# Patient Record
Sex: Female | Born: 2000 | Race: White | Hispanic: No | Marital: Single | Smoking: Never smoker
Health system: Southern US, Community
[De-identification: ages and names within clinical notes are randomized; demographics above are authoritative.]

## PROBLEM LIST (undated history)

## (undated) DIAGNOSIS — T7840XA Allergy, unspecified, initial encounter: Secondary | ICD-10-CM

---
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
## 2021-02-25 DIAGNOSIS — Z79899 Other long term (current) drug therapy: Secondary | ICD-10-CM | POA: Diagnosis not present

## 2021-02-25 DIAGNOSIS — L700 Acne vulgaris: Secondary | ICD-10-CM | POA: Diagnosis not present

## 2021-03-19 DIAGNOSIS — Z20828 Contact with and (suspected) exposure to other viral communicable diseases: Secondary | ICD-10-CM | POA: Diagnosis not present

## 2021-03-19 DIAGNOSIS — J029 Acute pharyngitis, unspecified: Secondary | ICD-10-CM | POA: Diagnosis not present

## 2021-04-15 DIAGNOSIS — L700 Acne vulgaris: Secondary | ICD-10-CM | POA: Diagnosis not present

## 2021-08-13 DIAGNOSIS — K649 Unspecified hemorrhoids: Secondary | ICD-10-CM | POA: Diagnosis not present

## 2021-09-18 DIAGNOSIS — Z124 Encounter for screening for malignant neoplasm of cervix: Secondary | ICD-10-CM | POA: Diagnosis not present

## 2021-09-18 DIAGNOSIS — Z01419 Encounter for gynecological examination (general) (routine) without abnormal findings: Secondary | ICD-10-CM | POA: Diagnosis not present

## 2021-09-22 DIAGNOSIS — L700 Acne vulgaris: Secondary | ICD-10-CM | POA: Diagnosis not present

## 2021-09-22 DIAGNOSIS — L710 Perioral dermatitis: Secondary | ICD-10-CM | POA: Diagnosis not present

## 2021-10-27 DIAGNOSIS — L578 Other skin changes due to chronic exposure to nonionizing radiation: Secondary | ICD-10-CM | POA: Diagnosis not present

## 2021-10-27 DIAGNOSIS — L814 Other melanin hyperpigmentation: Secondary | ICD-10-CM | POA: Diagnosis not present

## 2021-10-27 DIAGNOSIS — D2272 Melanocytic nevi of left lower limb, including hip: Secondary | ICD-10-CM | POA: Diagnosis not present

## 2021-10-27 DIAGNOSIS — D225 Melanocytic nevi of trunk: Secondary | ICD-10-CM | POA: Diagnosis not present

## 2022-01-18 DIAGNOSIS — Z113 Encounter for screening for infections with a predominantly sexual mode of transmission: Secondary | ICD-10-CM | POA: Diagnosis not present

## 2022-01-18 DIAGNOSIS — Z114 Encounter for screening for human immunodeficiency virus [HIV]: Secondary | ICD-10-CM | POA: Diagnosis not present

## 2022-03-08 DIAGNOSIS — J069 Acute upper respiratory infection, unspecified: Secondary | ICD-10-CM | POA: Diagnosis not present

## 2022-03-08 DIAGNOSIS — J060 Acute laryngopharyngitis: Secondary | ICD-10-CM | POA: Diagnosis not present

## 2022-03-18 DIAGNOSIS — Z0000 Encounter for general adult medical examination without abnormal findings: Secondary | ICD-10-CM | POA: Diagnosis not present

## 2022-03-18 DIAGNOSIS — Z713 Dietary counseling and surveillance: Secondary | ICD-10-CM | POA: Diagnosis not present

## 2022-03-18 DIAGNOSIS — Z6820 Body mass index (BMI) 20.0-20.9, adult: Secondary | ICD-10-CM | POA: Diagnosis not present

## 2022-03-18 DIAGNOSIS — J45990 Exercise induced bronchospasm: Secondary | ICD-10-CM | POA: Diagnosis not present

## 2022-03-18 DIAGNOSIS — Z1331 Encounter for screening for depression: Secondary | ICD-10-CM | POA: Diagnosis not present

## 2022-04-16 DIAGNOSIS — Z79899 Other long term (current) drug therapy: Secondary | ICD-10-CM | POA: Diagnosis not present

## 2022-04-16 DIAGNOSIS — L700 Acne vulgaris: Secondary | ICD-10-CM | POA: Diagnosis not present

## 2022-04-30 DIAGNOSIS — H43391 Other vitreous opacities, right eye: Secondary | ICD-10-CM | POA: Diagnosis not present

## 2022-05-04 DIAGNOSIS — Z23 Encounter for immunization: Secondary | ICD-10-CM | POA: Diagnosis not present

## 2022-05-04 DIAGNOSIS — Z111 Encounter for screening for respiratory tuberculosis: Secondary | ICD-10-CM | POA: Diagnosis not present

## 2022-05-06 DIAGNOSIS — Z0279 Encounter for issue of other medical certificate: Secondary | ICD-10-CM | POA: Diagnosis not present

## 2022-05-19 DIAGNOSIS — Z23 Encounter for immunization: Secondary | ICD-10-CM | POA: Diagnosis not present

## 2022-05-19 DIAGNOSIS — Z111 Encounter for screening for respiratory tuberculosis: Secondary | ICD-10-CM | POA: Diagnosis not present

## 2022-05-21 DIAGNOSIS — Z79899 Other long term (current) drug therapy: Secondary | ICD-10-CM | POA: Diagnosis not present

## 2022-05-21 DIAGNOSIS — L700 Acne vulgaris: Secondary | ICD-10-CM | POA: Diagnosis not present

## 2022-05-22 DIAGNOSIS — Z0279 Encounter for issue of other medical certificate: Secondary | ICD-10-CM | POA: Diagnosis not present

## 2022-06-22 DIAGNOSIS — Z79899 Other long term (current) drug therapy: Secondary | ICD-10-CM | POA: Diagnosis not present

## 2022-06-22 DIAGNOSIS — L308 Other specified dermatitis: Secondary | ICD-10-CM | POA: Diagnosis not present

## 2022-06-22 DIAGNOSIS — L700 Acne vulgaris: Secondary | ICD-10-CM | POA: Diagnosis not present

## 2022-07-22 DIAGNOSIS — Z79899 Other long term (current) drug therapy: Secondary | ICD-10-CM | POA: Diagnosis not present

## 2022-07-22 DIAGNOSIS — L700 Acne vulgaris: Secondary | ICD-10-CM | POA: Diagnosis not present

## 2022-08-05 DIAGNOSIS — H04303 Unspecified dacryocystitis of bilateral lacrimal passages: Secondary | ICD-10-CM | POA: Diagnosis not present

## 2022-08-23 DIAGNOSIS — Z79899 Other long term (current) drug therapy: Secondary | ICD-10-CM | POA: Diagnosis not present

## 2022-08-23 DIAGNOSIS — L700 Acne vulgaris: Secondary | ICD-10-CM | POA: Diagnosis not present

## 2022-09-17 ENCOUNTER — Ambulatory Visit: Payer: Self-pay | Admitting: Podiatry

## 2022-09-21 DIAGNOSIS — L600 Ingrowing nail: Secondary | ICD-10-CM | POA: Diagnosis not present

## 2022-09-22 DIAGNOSIS — Z79899 Other long term (current) drug therapy: Secondary | ICD-10-CM | POA: Diagnosis not present

## 2022-09-22 DIAGNOSIS — L700 Acne vulgaris: Secondary | ICD-10-CM | POA: Diagnosis not present

## 2022-09-24 DIAGNOSIS — Z6820 Body mass index (BMI) 20.0-20.9, adult: Secondary | ICD-10-CM | POA: Diagnosis not present

## 2022-09-24 DIAGNOSIS — Z01419 Encounter for gynecological examination (general) (routine) without abnormal findings: Secondary | ICD-10-CM | POA: Diagnosis not present

## 2022-10-04 ENCOUNTER — Ambulatory Visit: Payer: BC Managed Care – PPO | Admitting: Podiatry

## 2022-10-04 ENCOUNTER — Encounter: Payer: Self-pay | Admitting: Podiatry

## 2022-10-04 DIAGNOSIS — L600 Ingrowing nail: Secondary | ICD-10-CM | POA: Diagnosis not present

## 2022-10-04 NOTE — Patient Instructions (Signed)

## 2022-10-04 NOTE — Progress Notes (Signed)
Subjective:   Patient ID: Amanda Cross, female   DOB: 22 y.o.   MRN: [id number]   HPI Patient presents with her mother with painful ingrown toenails of both borders big toe right and the second toe lateral border stating both of them have really been hurting her and she is on Accutane and seems that that is where it is coming from.  Patient does not smoke likes to be active   Review of Systems  All other systems reviewed and are negative.       Objective:  Physical Exam Vitals and nursing note reviewed.  Constitutional:      Appearance: She is well-developed.  Pulmonary:     Effort: Pulmonary effort is normal.  Musculoskeletal:        General: Normal range of motion.  Skin:    General: Skin is warm.  Neurological:     Mental Status: She is alert.     Neurovascular status intact muscle strength adequate range of motion within normal limits with incurvated medial lateral border of the right hallux with irritation of the tissue and the lateral border of the right second toe with irritation of the tissue left with specks previously doing well.  Good digital perfusion well-oriented     Assessment:  Ingrown toenail x 2 right hallux and second toe     Plan:  H&P reviewed both conditions recommended correction explained procedure risk patient wants surgery and signed consent form understanding risk.  Today I infiltrated the right hallux and second toe with 60 mg like Marcaine mixture sterile prep done using sterile instrumentation removed the medial lateral borders of the right hallux and the lateral border of the right second toe exposed matrix applied phenol 3 applications 30 seconds to each border followed by alcohol lavage sterile dressing gave instructions on soaks reappoint to recheck with all questions answered today

## 2022-10-08 ENCOUNTER — Emergency Department (HOSPITAL_BASED_OUTPATIENT_CLINIC_OR_DEPARTMENT_OTHER): Payer: BC Managed Care – PPO

## 2022-10-08 ENCOUNTER — Other Ambulatory Visit: Payer: Self-pay

## 2022-10-08 ENCOUNTER — Emergency Department (HOSPITAL_BASED_OUTPATIENT_CLINIC_OR_DEPARTMENT_OTHER)
Admission: EM | Admit: 2022-10-08 | Discharge: 2022-10-08 | Disposition: A | Discharge status: Home / Self Care | Payer: BC Managed Care – PPO | Source: Home / Self Care | Attending: Emergency Medicine | Admitting: Emergency Medicine

## 2022-10-08 ENCOUNTER — Encounter (HOSPITAL_BASED_OUTPATIENT_CLINIC_OR_DEPARTMENT_OTHER): Payer: Self-pay | Admitting: Emergency Medicine

## 2022-10-08 DIAGNOSIS — M79674 Pain in right toe(s): Secondary | ICD-10-CM | POA: Diagnosis not present

## 2022-10-08 DIAGNOSIS — L600 Ingrowing nail: Secondary | ICD-10-CM | POA: Diagnosis not present

## 2022-10-08 DIAGNOSIS — L089 Local infection of the skin and subcutaneous tissue, unspecified: Secondary | ICD-10-CM

## 2022-10-08 DIAGNOSIS — M7989 Other specified soft tissue disorders: Secondary | ICD-10-CM | POA: Insufficient documentation

## 2022-10-08 DIAGNOSIS — R202 Paresthesia of skin: Secondary | ICD-10-CM | POA: Diagnosis not present

## 2022-10-08 DIAGNOSIS — S91301A Unspecified open wound, right foot, initial encounter: Secondary | ICD-10-CM | POA: Diagnosis not present

## 2022-10-08 MED ORDER — DOXYCYCLINE HYCLATE 100 MG PO CAPS: 100.0000 mg | ORAL_CAPSULE | Freq: Two times a day (BID) | ORAL | 0 refills | Status: DC

## 2022-10-08 MED ORDER — SULFAMETHOXAZOLE-TRIMETHOPRIM 800-160 MG PO TABS
1.0000 | ORAL_TABLET | Freq: Once | ORAL | Status: AC
  Administered 2022-10-08: 1 via ORAL
  Filled 2022-10-08: qty 1

## 2022-10-08 MED ORDER — SULFAMETHOXAZOLE-TRIMETHOPRIM 800-160 MG PO TABS: 1.0000 | ORAL_TABLET | Freq: Two times a day (BID) | ORAL | 0 refills | Status: AC

## 2022-10-08 NOTE — ED Provider Notes (Signed)
[facility] AT [facility] Provider Note   CSN: [id number] Arrival date & time: 10/08/22  1453     History {Add pertinent medical, surgical, social history, OB history to HPI:1} Chief Complaint  Patient presents with   Toe Pain    Amanda Cross is a 22 y.o. female who presents to the ED today for right toe pain. Patient reports that she saw a podiatrist 5 days ago for ingrown nails for removal of the toenails of her big toe and second toe of the right foot. Patient reports pain to those toes since then. Prior to seeing the podiatrist, patient's PCP sent her in a prescription for Amoxicillin for possible infection of the toe nails. Patient did not take the medication because she thought they were getting better until 2 days ago.  Additionally, she was at the beach with her family for the past several days. She kept her foot wrapped and did not go in the water but she was walking around in the sand.  Now, patient has pain and redness to her 1st and 2nd toes on her right foot. She reports tingling to the toes and is not able to bare weight on them. No fevers. Patient maintains sensation and range of motion to the toes. She has taken Ibuprofen for the pain with some relief. No other complaints or concerns at this time.    Home Medications Prior to Admission medications   Medication Sig Start Date End Date Taking? Authorizing Provider  albuterol (VENTOLIN HFA) 108 (90 Base) MCG/ACT inhaler Inhale into the lungs every 6 (six) hours as needed for wheezing or shortness of breath.    [provider]  mupirocin ointment (BACTROBAN) 2 %  09/07/16   [provider]  norgestrel-ethinyl estradiol (LO/OVRAL) 0.3-30 MG-MCG tablet Take 1 tablet by mouth daily.    Alver Fisher, RN  ondansetron (ZOFRAN) 4 MG tablet Take 1 tablet (4 mg total) by mouth every 8 (eight) hours as needed for nausea or vomiting. 06/04/19   Scheeler, Kermit Balo, PA-C  terbinafine  (LAMISIL AT) 1 % cream Apply 1 application topically 2 (two) times daily. 03/18/19   Bosie Clos, MD      Allergies    Patient has no known allergies.    Review of Systems   Review of Systems  Skin:        Pain and swelling to her 1st and 2nd toe of the right foot  All other systems reviewed and are negative.   Physical Exam Updated Vital Signs BP 129/82 (BP Location: Right Arm)   Pulse 66   Temp 98.2 F (36.8 C)   Resp 16   SpO2 100%  Physical Exam Vitals and nursing note reviewed.  Constitutional:      Appearance: Normal appearance.  HENT:     Head: Normocephalic and atraumatic.     Mouth/Throat:     Mouth: Mucous membranes are moist.  Eyes:     Conjunctiva/sclera: Conjunctivae normal.     Pupils: Pupils are equal, round, and reactive to light.  Cardiovascular:     Pulses: Normal pulses.  Pulmonary:     Effort: Pulmonary effort is normal.     Breath sounds: Normal breath sounds.  Abdominal:     Palpations: Abdomen is soft.     Tenderness: There is no abdominal tenderness.  Skin:    General: Skin is warm and dry.     Findings: Erythema and lesion present. No rash.  Comments: Erythema and edema to the 1st and 2nd toes of the right foot  Neurological:     General: No focal deficit present.     Mental Status: She is alert.  Psychiatric:        Mood and Affect: Mood normal.        Behavior: Behavior normal.     ED Results / Procedures / Treatments   Labs (all labs ordered are listed, but only abnormal results are displayed) Labs Reviewed - No data to display  EKG None  Radiology No results found.  Procedures Procedures: not indicated. {Document cardiac monitor, telemetry assessment procedure when appropriate:1}  Medications Ordered in ED Medications - No data to display  ED Course/ Medical Decision Making/ A&P   {   Click here for ABCD2, HEART and other calculatorsREFRESH Note before signing :1}                          Medical  Decision Making Amount and/or Complexity of Data Reviewed Radiology: ordered.   Patient is a 22 year old female with no significant past medical history presents the ED today with pain to the first and second digits of the right foot.  The pain started 2 days ago and has been worsening since.  My differentials include: Infected ingrown toenails, osteomyelitis, cellulitis, etc.  {Document critical care time when appropriate:1} {Document review of labs and clinical decision tools ie heart score, Chads2Vasc2 etc:1}  {Document your independent review of radiology images, and any outside records:1} {Document your discussion with family members, caretakers, and with consultants:1} {Document social determinants of health affecting pt's care:1} {Document your decision making why or why not admission, treatments were needed:1} Final Clinical Impression(s) / ED Diagnoses Final diagnoses:  None    Rx / DC Orders ED Discharge Orders     None

## 2022-10-08 NOTE — Discharge Instructions (Addendum)
Stop Amoxicillin. Start Bactrim twice a day for 7 days. Please complete entirety of medication.  Call podiatrist and follow up with them on Monday for further evaluation. Continue epsom salt soaks.  Return to ED if streaking or increased/worsening redness to toes.

## 2022-10-08 NOTE — ED Triage Notes (Addendum)
Pt presents to ED Pov. Pt c/o r big toe pain, redness and swelling since Wednesday. Started taking amoxicillin today

## 2022-10-08 NOTE — ED Notes (Signed)
Pt sitting in chair, pt has redness and swelling to her R big toe and 2nd toe, pt states that she recently had surgery for an ingrown toenail.  Pt doesn't have swelling to ankle or leg.

## 2022-10-20 ENCOUNTER — Encounter: Payer: Self-pay | Admitting: Podiatry

## 2022-10-20 ENCOUNTER — Ambulatory Visit: Payer: BC Managed Care – PPO | Admitting: Podiatry

## 2022-10-20 DIAGNOSIS — L03031 Cellulitis of right toe: Secondary | ICD-10-CM | POA: Diagnosis not present

## 2022-10-20 NOTE — Progress Notes (Signed)
Subjective:   Patient ID: Amanda Cross, female   DOB: 22 y.o.   MRN: [id number]   HPI Patient presents for follow-up states that she did get an infection we were close she went to the emergency room is on antibiotics seems better now but still has some redness wants it checked   ROS      Objective:  Physical Exam  Neuro vascular status intact with crusted tissue on the right hallux second toe that is localized no proximal edema edema currently noted significant reduction of discomfort     Assessment:  Paronychia infection right appears to be resolved     Plan:  Advised on soaks and bandage usage during the day and this should heal uneventfully if any further redness were to occur let us know immediately other than that appears healed well

## 2022-10-22 DIAGNOSIS — L810 Postinflammatory hyperpigmentation: Secondary | ICD-10-CM | POA: Diagnosis not present

## 2022-10-22 DIAGNOSIS — Z79899 Other long term (current) drug therapy: Secondary | ICD-10-CM | POA: Diagnosis not present

## 2022-10-22 DIAGNOSIS — L700 Acne vulgaris: Secondary | ICD-10-CM | POA: Diagnosis not present

## 2022-11-03 ENCOUNTER — Ambulatory Visit: Payer: BC Managed Care – PPO | Admitting: Podiatry

## 2022-11-03 ENCOUNTER — Encounter: Payer: Self-pay | Admitting: Podiatry

## 2022-11-03 DIAGNOSIS — L03032 Cellulitis of left toe: Secondary | ICD-10-CM

## 2022-11-03 NOTE — Patient Instructions (Signed)

## 2022-11-04 NOTE — Progress Notes (Signed)
Subjective:   Patient ID: Amanda Cross, female   DOB: 22 y.o.   MRN: [id number]   HPI Patient presents stating that the big toenail left has started to become sore and the right still gives her irritation but minimal discomfort.  Patient presents with mother and still taking Accutane   ROS      Objective:  Physical Exam  Neurovascular status intact with patient found to have crusted right hallux medial lateral border second digit and on the left hallux and noted redness on the lateral border no active drainage no proximal edema erythema drainage     Assessment:  Paronychia infection left hallux lateral border with a well-healing ingrown toenail site right     Plan:  H&P reviewed for the left have not recommended permanent procedure she is getting ready to go back to school but I do think due to the infection it needs to be resolved and I anesthetized 60 mg Xylocaine Marcaine mixture sterile prep done using sterile instrumentation remove the lateral border removed necrotic tissue flushed the wound applied sterile dressing instructed on soaks and continue bandage usage for the right

## 2022-11-24 DIAGNOSIS — K130 Diseases of lips: Secondary | ICD-10-CM | POA: Diagnosis not present

## 2022-11-24 DIAGNOSIS — L308 Other specified dermatitis: Secondary | ICD-10-CM | POA: Diagnosis not present

## 2022-11-24 DIAGNOSIS — Z79899 Other long term (current) drug therapy: Secondary | ICD-10-CM | POA: Diagnosis not present

## 2022-11-24 DIAGNOSIS — L700 Acne vulgaris: Secondary | ICD-10-CM | POA: Diagnosis not present

## 2022-12-07 DIAGNOSIS — R300 Dysuria: Secondary | ICD-10-CM | POA: Diagnosis not present

## 2022-12-29 DIAGNOSIS — Z79899 Other long term (current) drug therapy: Secondary | ICD-10-CM | POA: Diagnosis not present

## 2022-12-29 DIAGNOSIS — L700 Acne vulgaris: Secondary | ICD-10-CM | POA: Diagnosis not present

## 2023-01-28 DIAGNOSIS — L700 Acne vulgaris: Secondary | ICD-10-CM | POA: Diagnosis not present

## 2023-01-28 DIAGNOSIS — Z79899 Other long term (current) drug therapy: Secondary | ICD-10-CM | POA: Diagnosis not present

## 2023-03-06 DIAGNOSIS — R197 Diarrhea, unspecified: Secondary | ICD-10-CM | POA: Diagnosis not present

## 2023-03-06 DIAGNOSIS — R112 Nausea with vomiting, unspecified: Secondary | ICD-10-CM | POA: Diagnosis not present

## 2023-03-06 DIAGNOSIS — R1084 Generalized abdominal pain: Secondary | ICD-10-CM | POA: Diagnosis not present

## 2023-03-06 DIAGNOSIS — A084 Viral intestinal infection, unspecified: Secondary | ICD-10-CM | POA: Diagnosis not present

## 2023-03-10 ENCOUNTER — Other Ambulatory Visit: Payer: Self-pay

## 2023-03-10 ENCOUNTER — Emergency Department (HOSPITAL_BASED_OUTPATIENT_CLINIC_OR_DEPARTMENT_OTHER)
Admission: EM | Admit: 2023-03-10 | Discharge: 2023-03-10 | Disposition: A | Discharge status: Home / Self Care | Payer: BC Managed Care – PPO | Attending: Emergency Medicine | Admitting: Emergency Medicine

## 2023-03-10 ENCOUNTER — Encounter (HOSPITAL_BASED_OUTPATIENT_CLINIC_OR_DEPARTMENT_OTHER): Payer: Self-pay

## 2023-03-10 DIAGNOSIS — K529 Noninfective gastroenteritis and colitis, unspecified: Secondary | ICD-10-CM | POA: Diagnosis not present

## 2023-03-10 DIAGNOSIS — R197 Diarrhea, unspecified: Secondary | ICD-10-CM | POA: Diagnosis not present

## 2023-03-10 LAB — CBC
HCT: 42.8 % (ref 36.0–46.0)
Hemoglobin: 14.3 g/dL (ref 12.0–15.0)
MCH: 29.9 pg (ref 26.0–34.0)
MCHC: 33.4 g/dL (ref 30.0–36.0)
MCV: 89.5 fL (ref 80.0–100.0)
Platelets: 253 10*3/uL (ref 150–400)
RBC: 4.78 MIL/uL (ref 3.87–5.11)
RDW: 11.9 % (ref 11.5–15.5)
WBC: 5.1 10*3/uL (ref 4.0–10.5)
nRBC: 0 % (ref 0.0–0.2)

## 2023-03-10 LAB — COMPREHENSIVE METABOLIC PANEL
ALT: 12 U/L (ref 0–44)
AST: 16 U/L (ref 15–41)
Albumin: 4.6 g/dL (ref 3.5–5.0)
Alkaline Phosphatase: 50 U/L (ref 38–126)
Anion gap: 7 (ref 5–15)
BUN: 13 mg/dL (ref 6–20)
CO2: 30 mmol/L (ref 22–32)
Calcium: 10.2 mg/dL (ref 8.9–10.3)
Chloride: 102 mmol/L (ref 98–111)
Creatinine, Ser: 0.86 mg/dL (ref 0.44–1.00)
GFR, Estimated: >60 mL/min (ref >60)
Glucose, Bld: 109 mg/dL — ABNORMAL HIGH (ref 70–99)
Potassium: 4.5 mmol/L (ref 3.5–5.1)
Sodium: 139 mmol/L (ref 135–145)
Total Bilirubin: 0.5 mg/dL (ref <1.2)
Total Protein: 7.8 g/dL (ref 6.5–8.1)

## 2023-03-10 LAB — LIPASE, BLOOD: Lipase: 28 U/L (ref 11–51)

## 2023-03-10 LAB — URINALYSIS, ROUTINE W REFLEX MICROSCOPIC
Bilirubin Urine: NEGATIVE
Glucose, UA: NEGATIVE mg/dL
Hgb urine dipstick: NEGATIVE
Ketones, ur: NEGATIVE mg/dL
Leukocytes,Ua: NEGATIVE
Nitrite: NEGATIVE
Protein, ur: NEGATIVE mg/dL
Specific Gravity, Urine: 1.027 (ref 1.005–1.030)
pH: 6.5 (ref 5.0–8.0)

## 2023-03-10 LAB — PREGNANCY, URINE: Preg Test, Ur: NEGATIVE

## 2023-03-10 MED ORDER — DICYCLOMINE HCL 20 MG PO TABS: 20.0000 mg | ORAL_TABLET | Freq: Two times a day (BID) | ORAL | 0 refills | Status: DC

## 2023-03-10 NOTE — ED Triage Notes (Signed)
Pt c/o diarrhea x10 days, "mainly in the mornings & after I eat, it's just not seeming to get better." Went to UC for same Sunday, "said it was probably just a virus but I haven't felt bad otherwise."   LMP "about a week ago."

## 2023-03-10 NOTE — ED Notes (Signed)
Patient left before receiving discharge papers.  Not in room when came to D/C.

## 2023-03-10 NOTE — Discharge Instructions (Signed)
You were seen in the emergency room today for diarrhea.  And recommending taking Imodium 4mg  x1, then 2mg  after each loose stool with MAX of 16mg .  Please try bland diet, avoiding lactose might cheese or milk.  Please make sure you are staying well-hydrated with Pedialyte and Gatorade, water. return to emergency room if you have any new or worsening symptoms.

## 2023-03-10 NOTE — ED Provider Notes (Signed)
[place] EMERGENCY DEPARTMENT AT [facility] Provider Note   CSN: [id number] Arrival date & time: 03/10/23  1322     History  Chief Complaint  Patient presents with   Diarrhea    Amanda Cross is a 22 y.o. female patient without significant past medical history reporting to emergency room with approximately 1 week of nausea and diarrhea.  Patient reports she was seen at urgent care 3 days ago for similar thing.  Patient reports when this for started she was having up to 15 bowel movements a day.  Patient reports she is having less bowel movements.  Patient reports improvement in symptoms after Imodium but only temporarily.  Patient does not note any decreased appetite, or vomiting.  Patient notes abdominal pain but feels it is cramping and generalized in nature.  Denies any blood in stool, mucus, change in stools appearance.  Patient has been trying bland diet.  Denies any chest pain, shortness of breath, URI-like symptoms, fevers, chills.   Diarrhea      Home Medications Prior to Admission medications   Medication Sig Start Date End Date Taking? Authorizing Provider  albuterol (VENTOLIN HFA) 108 (90 Base) MCG/ACT inhaler Inhale into the lungs every 6 (six) hours as needed for wheezing or shortness of breath.    [provider]  mupirocin ointment (BACTROBAN) 2 %  09/07/16   [provider]  norgestrel-ethinyl estradiol (LO/OVRAL) 0.3-30 MG-MCG tablet Take 1 tablet by mouth daily.    Alver Fisher, RN  ondansetron (ZOFRAN) 4 MG tablet Take 1 tablet (4 mg total) by mouth every 8 (eight) hours as needed for nausea or vomiting. 06/04/19   Scheeler, Kermit Balo, PA-C  terbinafine (LAMISIL AT) 1 % cream Apply 1 application topically 2 (two) times daily. 03/18/19   Bosie Clos, MD      Allergies    Patient has no known allergies.    Review of Systems   Review of Systems  Gastrointestinal:  Positive for diarrhea.    Physical Exam Updated Vital  Signs BP (!) 131/92 (BP Location: Right Arm)   Pulse 97   Temp 98.1 F (36.7 C)   Resp 18   SpO2 100%  Physical Exam Vitals and nursing note reviewed.  Constitutional:      General: She is not in acute distress.    Appearance: She is not toxic-appearing.  HENT:     Head: Normocephalic and atraumatic.     Mouth/Throat:     Comments: Patient is well-appearing with moist mucous membranes.  Hemodynamically stable without signs of distress. Eyes:     General: No scleral icterus.    Conjunctiva/sclera: Conjunctivae normal.  Cardiovascular:     Rate and Rhythm: Normal rate and regular rhythm.     Pulses: Normal pulses.     Heart sounds: Normal heart sounds.  Pulmonary:     Effort: Pulmonary effort is normal. No respiratory distress.     Breath sounds: Normal breath sounds.  Abdominal:     General: Abdomen is flat. Bowel sounds are normal.     Palpations: Abdomen is soft.     Tenderness: There is no abdominal tenderness.     Comments: Abdomen is soft nontender  Skin:    General: Skin is warm and dry.     Findings: No lesion.  Neurological:     General: No focal deficit present.     Mental Status: She is alert and oriented to person, place, and time. Mental status is at  baseline.     ED Results / Procedures / Treatments   Labs (all labs ordered are listed, but only abnormal results are displayed) Labs Reviewed  COMPREHENSIVE METABOLIC PANEL - Abnormal; Notable for the following components:      Result Value   Glucose, Bld 109 (*)    All other components within normal limits  LIPASE, BLOOD  CBC  URINALYSIS, ROUTINE W REFLEX MICROSCOPIC  PREGNANCY, URINE    EKG None  Radiology No results found.  Procedures Procedures    Medications Ordered in ED Medications - No data to display  ED Course/ Medical Decision Making/ A&P                                 Medical Decision Making Amount and/or Complexity of Data Reviewed Labs: ordered.  Risk Prescription drug  management.   Amanda Cross 22 y.o. presented today for abd pain. Working DDx includes, but not limited to, gastroenteritis, colitis, SBO, appendicitis, cholecystitis, hepatobiliary pathology, gastritis, PUD, ACS, dissection, pancreatitis, nephrolithiasis, AAA, UTI, pyelonephritis, ruptured ectopic pregnancy, PID, ovarian torsion.  R/o DDx: These are considered less likely than current impression due to history of present illness, physical exam, labs/imaging findings.  Review of prior external notes: UC visit for same 03/06/2023  Pmhx: None   Unique Tests and My Interpretation:  CBC with differential: No leukocytosis, no anemia CMP: No electrolyte abnormality, no AKI.  Glucose 109 Lipase: wnl UA: No nitrates no leukocytes and no blood in urine. Urine Pregnancy: NEG   Imaging:  Considered however patient is not having any focal abdominal pain.  Patient has reassuring abdominal exam without any guarding.  Patient also does not have any leukocytosis nor fever.  Patient's abdominal pain comes and go and is cramping in nature and thus I do not feel imaging is necessary at this time.  Problem List / ED Course / Critical interventions / Medication management  Patient reporting to emergency room with 10 days of nausea and diarrhea.  Discussed obtaining stool samples however patient does not feel like she can have bowel movement.  Patient did not have bowel movement throughout stay.  Patient is overall reassuring workup.  Patient appears well-hydrated on exam with moist mucous membranes.  No AKI or electrolyte abnormality on blood work.  No leukocytosis.  Patient hemodynamically stable and tolerating p.o. intake.  Discussed bland diet as well as clear liquid diet and slowly transitioning back into normal foods.  Also discussed using Imodium or Pepto-Bismol regularly to control symptoms.  Discussed taking nausea medicine like Zofran.  Discussed importance of staying well-hydrated with water  alternating Pedialyte and Gatorade.  Patient family member agree and understand plan.  If symptoms change and will return to emergency room. Medication offered and declined. Patients vitals assessed. Upon arrival patient is hemodynamically stable.  I have reviewed the patients home medicines and have made adjustments as needed    Consult: none   Plan:  F/u w/ PCP in 2-3d to ensure resolution of sx.  Patient was given return precautions. Patient stable for discharge at this time.  Patient educated on current sx/dx and verbalized understanding of plan. Return to ER w/ new or worsening sx.          Final Clinical Impression(s) / ED Diagnoses Final diagnoses:  Gastroenteritis    Rx / DC Orders ED Discharge Orders     None  Smitty Knudsen, PA-C 03/11/23 0245    Glyn Ade, MD 03/14/23 [phone]

## 2023-03-12 ENCOUNTER — Encounter (HOSPITAL_BASED_OUTPATIENT_CLINIC_OR_DEPARTMENT_OTHER): Payer: Self-pay

## 2023-03-12 ENCOUNTER — Emergency Department (HOSPITAL_BASED_OUTPATIENT_CLINIC_OR_DEPARTMENT_OTHER)
Admission: EM | Admit: 2023-03-12 | Discharge: 2023-03-12 | Disposition: A | Discharge status: Home / Self Care | Payer: BC Managed Care – PPO | Attending: Emergency Medicine | Admitting: Emergency Medicine

## 2023-03-12 ENCOUNTER — Emergency Department (HOSPITAL_BASED_OUTPATIENT_CLINIC_OR_DEPARTMENT_OTHER): Payer: BC Managed Care – PPO

## 2023-03-12 ENCOUNTER — Telehealth (HOSPITAL_BASED_OUTPATIENT_CLINIC_OR_DEPARTMENT_OTHER): Payer: Self-pay | Admitting: Emergency Medicine

## 2023-03-12 ENCOUNTER — Other Ambulatory Visit: Payer: Self-pay

## 2023-03-12 DIAGNOSIS — R109 Unspecified abdominal pain: Secondary | ICD-10-CM | POA: Diagnosis not present

## 2023-03-12 DIAGNOSIS — R1031 Right lower quadrant pain: Secondary | ICD-10-CM | POA: Insufficient documentation

## 2023-03-12 DIAGNOSIS — R1032 Left lower quadrant pain: Secondary | ICD-10-CM | POA: Diagnosis not present

## 2023-03-12 LAB — CBC WITH DIFFERENTIAL/PLATELET
Abs Immature Granulocytes: 0.01 10*3/uL (ref 0.00–0.07)
Basophils Absolute: 0 10*3/uL (ref 0.0–0.1)
Basophils Relative: 1 %
Eosinophils Absolute: 0.2 10*3/uL (ref 0.0–0.5)
Eosinophils Relative: 3 %
HCT: 38.1 % (ref 36.0–46.0)
Hemoglobin: 12.9 g/dL (ref 12.0–15.0)
Immature Granulocytes: 0 %
Lymphocytes Relative: 31 %
Lymphs Abs: 1.9 10*3/uL (ref 0.7–4.0)
MCH: 30.2 pg (ref 26.0–34.0)
MCHC: 33.9 g/dL (ref 30.0–36.0)
MCV: 89.2 fL (ref 80.0–100.0)
Monocytes Absolute: 0.6 10*3/uL (ref 0.1–1.0)
Monocytes Relative: 10 %
Neutro Abs: 3.3 10*3/uL (ref 1.7–7.7)
Neutrophils Relative %: 55 %
Platelets: 199 10*3/uL (ref 150–400)
RBC: 4.27 MIL/uL (ref 3.87–5.11)
RDW: 11.8 % (ref 11.5–15.5)
WBC: 5.9 10*3/uL (ref 4.0–10.5)
nRBC: 0 % (ref 0.0–0.2)

## 2023-03-12 LAB — COMPREHENSIVE METABOLIC PANEL
ALT: 10 U/L (ref 0–44)
AST: 13 U/L — ABNORMAL LOW (ref 15–41)
Albumin: 4 g/dL (ref 3.5–5.0)
Alkaline Phosphatase: 50 U/L (ref 38–126)
Anion gap: 7 (ref 5–15)
BUN: 12 mg/dL (ref 6–20)
CO2: 27 mmol/L (ref 22–32)
Calcium: 9.1 mg/dL (ref 8.9–10.3)
Chloride: 103 mmol/L (ref 98–111)
Creatinine, Ser: 0.75 mg/dL (ref 0.44–1.00)
GFR, Estimated: >60 mL/min (ref >60)
Glucose, Bld: 93 mg/dL (ref 70–99)
Potassium: 3.9 mmol/L (ref 3.5–5.1)
Sodium: 137 mmol/L (ref 135–145)
Total Bilirubin: 0.5 mg/dL (ref <1.2)
Total Protein: 6.8 g/dL (ref 6.5–8.1)

## 2023-03-12 LAB — C DIFFICILE QUICK SCREEN W PCR REFLEX
C Diff antigen: POSITIVE — AB
C Diff toxin: NEGATIVE

## 2023-03-12 LAB — PREGNANCY, URINE: Preg Test, Ur: NEGATIVE

## 2023-03-12 MED ORDER — VANCOMYCIN HCL 125 MG PO CAPS: 125.0000 mg | ORAL_CAPSULE | Freq: Four times a day (QID) | ORAL | 0 refills | Status: DC

## 2023-03-12 MED ORDER — LACTATED RINGERS IV BOLUS
1000.0000 mL | Freq: Once | INTRAVENOUS | Status: AC
  Administered 2023-03-12: 1000 mL via INTRAVENOUS

## 2023-03-12 MED ORDER — VANCOMYCIN HCL 125 MG PO CAPS: 125.0000 mg | ORAL_CAPSULE | Freq: Four times a day (QID) | ORAL | 0 refills | Status: AC

## 2023-03-12 MED ORDER — IOHEXOL 300 MG/ML  SOLN
80.0000 mL | Freq: Once | INTRAMUSCULAR | Status: AC | PRN
  Administered 2023-03-12: 80 mL via INTRAVENOUS

## 2023-03-12 NOTE — Telephone Encounter (Signed)
C. difficile positive.  Will send in oral vancomycin.  Will have her follow-up with her primary care doctor.

## 2023-03-12 NOTE — ED Notes (Signed)
Patient given discharge instructions. Questions were answered. Patient verbalized understanding of discharge instructions and care at home.  

## 2023-03-12 NOTE — ED Notes (Signed)
Spoke with patient about positive C diff antigen result; MD Curatolo has advised for patient to start course of abx to treat c diff. Pt educated on abx course and to return to the ER if she does not feel better. Pt verbalized understanding. 03/12/23 1955 (when call ended)

## 2023-03-12 NOTE — ED Triage Notes (Signed)
Pt c/o diarrhea x12 days. Pt reports random episodes throughout the day. Pt reports she was seen here and at [facility] and s/s are still present. Pt reports "nothing" is helping.Pt reports LMP about 1 week ago.

## 2023-03-12 NOTE — Telephone Encounter (Signed)
Abx sent to different pharm

## 2023-03-12 NOTE — ED Provider Notes (Signed)
[place] EMERGENCY DEPARTMENT AT [facility] Provider Note   CSN: [id number] Arrival date & time: 03/12/23  0631     History  Chief Complaint  Patient presents with   Abdominal Pain    Amanda Cross is a 22 y.o. female.  HPI     22 year old female without significant medical history, recent urgent care and emergency department visit for nausea and diarrhea who presents with concern for abdominal pain, mild continued loose stool.  Symptoms started approximately 12 days ago. When this started she was having up to 15 bowel movements a day  Abdominal pain getting worse rather than better, feels like more severe cramping, when severe it is bad, then is sore in between, can feel gas pain.  When pain severe will gag, nausea. Otherwise no vomiting. No urinary symptoms. No fever. Appetite is ok but worried about pain being worse with eating. No vaginal bleeding or discharge. Mucus diarrhea, at times green or yellow.  Eating less.  Loose stool at this point, 2 times a day.   No other medical problems, someone in fam with diverticulitis no known IBD hx    Past Medical History:  Diagnosis Date   Allergy    exercise     Home Medications Prior to Admission medications   Medication Sig Start Date End Date Taking? Authorizing Provider  albuterol (VENTOLIN HFA) 108 (90 Base) MCG/ACT inhaler Inhale into the lungs every 6 (six) hours as needed for wheezing or shortness of breath.    [provider]  dicyclomine (BENTYL) 20 MG tablet Take 1 tablet (20 mg total) by mouth 2 (two) times daily. 03/10/23   Barrett, Horald Chestnut, PA-C  mupirocin ointment (BACTROBAN) 2 %  09/07/16   [provider]  norgestrel-ethinyl estradiol (LO/OVRAL) 0.3-30 MG-MCG tablet Take 1 tablet by mouth daily.    Alver Fisher, RN  ondansetron (ZOFRAN) 4 MG tablet Take 1 tablet (4 mg total) by mouth every 8 (eight) hours as needed for nausea or vomiting. 06/04/19   Scheeler, Kermit Balo,  PA-C  terbinafine (LAMISIL AT) 1 % cream Apply 1 application topically 2 (two) times daily. 03/18/19   Bosie Clos, MD  vancomycin (VANCOCIN) 125 MG capsule Take 1 capsule (125 mg total) by mouth 4 (four) times daily for 10 days. 03/12/23 03/22/23  Virgina Norfolk, DO      Allergies    Patient has no known allergies.    Review of Systems   Review of Systems  Physical Exam Updated Vital Signs BP 111/75 (BP Location: Right Arm)   Pulse 70   Temp 98.1 F (36.7 C) (Oral)   Resp 20   Ht 5\' 6"  (1.676 m)   Wt 58.1 kg   LMP 03/02/2023   SpO2 100%   BMI 20.66 kg/m  Physical Exam Vitals and nursing note reviewed.  Constitutional:      General: She is not in acute distress.    Appearance: She is well-developed. She is not diaphoretic.  HENT:     Head: Normocephalic and atraumatic.  Eyes:     Conjunctiva/sclera: Conjunctivae normal.  Cardiovascular:     Rate and Rhythm: Normal rate and regular rhythm.  Pulmonary:     Effort: Pulmonary effort is normal. No respiratory distress.  Abdominal:     General: There is no distension.     Palpations: Abdomen is soft.     Tenderness: There is abdominal tenderness in the right lower quadrant and left lower quadrant. There is no guarding.  Musculoskeletal:        General: No tenderness.     Cervical back: Normal range of motion.  Skin:    General: Skin is warm and dry.     Findings: No erythema or rash.  Neurological:     Mental Status: She is alert and oriented to person, place, and time.     ED Results / Procedures / Treatments   Labs (all labs ordered are listed, but only abnormal results are displayed) Labs Reviewed  C DIFFICILE QUICK SCREEN W PCR REFLEX   - Abnormal; Notable for the following components:      Result Value   C Diff antigen POSITIVE (*)    All other components within normal limits  COMPREHENSIVE METABOLIC PANEL - Abnormal; Notable for the following components:   AST 13 (*)    All other components within  normal limits  GASTROINTESTINAL PANEL BY PCR, STOOL (REPLACES STOOL CULTURE)  CLOSTRIDIUM DIFFICILE BY PCR, REFLEXED  CBC WITH DIFFERENTIAL/PLATELET  PREGNANCY, URINE    EKG None  Radiology CT ABDOMEN PELVIS W CONTRAST  Result Date: 03/12/2023 CLINICAL DATA:  Abdominal pain. EXAM: CT ABDOMEN AND PELVIS WITH CONTRAST TECHNIQUE: Multidetector CT imaging of the abdomen and pelvis was performed using the standard protocol following bolus administration of intravenous contrast. RADIATION DOSE REDUCTION: This exam was performed according to the departmental dose-optimization program which includes automated exposure control, adjustment of the mA and/or kV according to patient size and/or use of iterative reconstruction technique. CONTRAST:  80mL OMNIPAQUE IOHEXOL 300 MG/ML  SOLN COMPARISON:  None Available. FINDINGS: Lower chest: Lung bases are clear. No pleural or pericardial effusion. Hepatobiliary: No focal liver abnormality is seen. No gallstones, gallbladder wall thickening, or biliary dilatation. Pancreas: Unremarkable. No pancreatic ductal dilatation or surrounding inflammatory changes. Spleen: Normal in size without focal abnormality. Adrenals/Urinary Tract: Adrenal glands are unremarkable. Kidneys are normal, without renal calculi, focal lesion, or hydronephrosis. Bladder is unremarkable. Stomach/Bowel: Stomach is within normal limits. Appendix appears normal. No evidence of bowel wall thickening, distention, or inflammatory changes. Vascular/Lymphatic: No significant vascular findings are present. No enlarged abdominal or pelvic lymph nodes. Reproductive: Uterus and bilateral adnexa are unremarkable. Other: No abdominal wall hernia or abnormality. No abdominopelvic ascites. Musculoskeletal: No acute or significant osseous findings. IMPRESSION: Unremarkable examination of the abdomen and pelvis. Electronically Signed   By: Layla Maw M.D.   On: 03/12/2023 09:46    Procedures Procedures     Medications Ordered in ED Medications  lactated ringers bolus 1,000 mL (0 mLs Intravenous Stopped 03/12/23 0930)  iohexol (OMNIPAQUE) 300 MG/ML solution 80 mL (80 mLs Intravenous Contrast Given 03/12/23 0908)    ED Course/ Medical Decision Making/ A&P                                  22 year old female without significant medical history, recent urgent care and emergency department visit for nausea and diarrhea who presents with concern for abdominal pain, mild continued loose stool.  DDx includes appendicitis, pancreatitis, cholecystitis, pyelonephritis, nephrolithiasis, diverticulitis, colitis, IBD, gastroenteritis, PID, ovarian torsion, ectopic pregnancy, and tuboovarian abscess   Labs completed and interpreted by me show no evidence of anemia, electrolyte abnormality, AKI, transaminitis, pancreatitis, nor leukocytosis. Pregnancy test negative. No urinary symptoms, doubt UTI. Low suspicon for ovarian torsion/pelvic etiology of pain in setting of primary symptom of diarrhea initially. Not having RUQ pain primarily and doubt cholecystitis. Given 12 days with continued loose  stool did send gi pathogen panel and cdiff.   CT abdomen pelvis obtained given tenderness on exam shows no sign of appendicitis, diverticulitis or other acute abnormalities.   Suspect symptoms related to resolving infectious gastroenteritis with mild continuing loose stool and associated pain. Recommend continued supportive care and PCP follow up> Stool studies pending.        Final Clinical Impression(s) / ED Diagnoses Final diagnoses:  Abdominal cramping    Rx / DC Orders ED Discharge Orders     None         Alvira Monday, MD 03/12/23 2228

## 2023-03-13 LAB — GASTROINTESTINAL PANEL BY PCR, STOOL (REPLACES STOOL CULTURE)

## 2023-03-13 LAB — CLOSTRIDIUM DIFFICILE BY PCR, REFLEXED: Toxigenic C. Difficile by PCR: POSITIVE — AB

## 2023-03-14 ENCOUNTER — Telehealth: Payer: Self-pay | Admitting: *Deleted

## 2023-03-14 NOTE — Telephone Encounter (Signed)
  Chief Complaint: Results Symptoms: NA Frequency: NA Pertinent Negatives: Patient denies NA Disposition: [] ED /[] Urgent Care (no appt availability in office) / [] Appointment(In office/virtual)/ []  [facility]/ [] Home Care/ [] Refused Recommended Disposition /[]  Mobile Bus/ []  Follow-up with PCP Additional Notes:   Pt viewed labs from ED on MyChart. Questioning results. Placed on Vanco for Cdiff. Questioning if ATB covers other bacterium positive. Also asking for prophylactic yeast med. Advised mobile Clinic. States she will call her OB/GYN. NO PCP.

## 2023-04-01 ENCOUNTER — Ambulatory Visit (HOSPITAL_BASED_OUTPATIENT_CLINIC_OR_DEPARTMENT_OTHER): Payer: BC Managed Care – PPO | Admitting: Family Medicine

## 2023-04-05 LAB — MISCELLANEOUS TEST

## 2023-04-07 ENCOUNTER — Ambulatory Visit (HOSPITAL_BASED_OUTPATIENT_CLINIC_OR_DEPARTMENT_OTHER): Payer: BC Managed Care – PPO | Admitting: Family Medicine

## 2023-04-07 ENCOUNTER — Telehealth (HOSPITAL_BASED_OUTPATIENT_CLINIC_OR_DEPARTMENT_OTHER): Payer: Self-pay | Admitting: *Deleted

## 2023-04-07 VITALS — BP 117/70 | HR 66 | Ht 66.5 in | Wt 129.0 lb

## 2023-04-07 DIAGNOSIS — A498 Other bacterial infections of unspecified site: Secondary | ICD-10-CM

## 2023-04-07 DIAGNOSIS — Z7689 Persons encountering health services in other specified circumstances: Secondary | ICD-10-CM

## 2023-04-07 NOTE — Telephone Encounter (Signed)
 Patient is scheduled with gastro 1/8 that was first available for gastro at [facility]

## 2023-04-07 NOTE — Progress Notes (Signed)
 New Patient Office Visit  Subjective:   Amanda  Amanda Cross [birth year] 04/07/2023  Chief Complaint  Patient presents with   New Patient (Initial Visit)    Patient is here to get established with the practice. States she has been having problems with c-diff. States she has been on antibiotics two different times and cannot get rid of it.    HPI: Amanda Cross  KRISTYN OBYRNE presents today to establish care at [facility] at [facility]. Introduced to publishing rights manager role and practice setting.  All questions answered.   Last PCP: [facility] Last annual physical:  Concerns: See below   Patient began having recurrent diarrhea and abdomianl pain on 03/06/23 and went urgent care and was given supportive care for viral gastroenteritis. She went to ED on 12/05/ and 12/07 and had stool testing completed. Her stool culture was positive for yersinia and Cdiff on 03/14/2023. She was placed on Vancomycin  125mg  QID x 10 days.   She reports her diarrhea resolved with antibiotic first round and reports she traveled to [place]. She had finished antibiotics (Vancomycin  QID for 10 days and began having diarrhea once again 1 week after finishing therapy.   She states her uncle who is a PCP refilled her vancomycin  QID for 10 days for assumed recurrent cdiff infection. She is half way through her 2nd antibiotic course of vancomycin  and reports resolution of diarrhea. She is taking probiotic daily as well. She states she is concerned diarrhea will return after finished abx and would like to see GI specialist.    The following portions of the patient's history were reviewed and updated as appropriate: past medical history, past surgical history, family history, social history, allergies, medications, and problem list.   There are no active problems to display for this patient.  Past Medical History:  Diagnosis Date   Allergy    exercise   Past Surgical History:   Procedure Laterality Date   CYST EXCISION Left 06/08/2019   Procedure: Excision of left groin cyst;  Surgeon: Elisabeth Craig RAMAN, MD;  Location: [facility];  Service: Plastics;  Laterality: Left;   Family History  Problem Relation Age of Onset   Asthma Father    Social History   Socioeconomic History   Marital status: Single    Spouse name: Not on file   Number of children: Not on file   Years of education: Not on file   Highest education level: 12th grade  Occupational History   Not on file  Tobacco Use   Smoking status: Never   Smokeless tobacco: Never   Tobacco comments:    i dont use  Vaping Use   Vaping status: Never Used  Substance and Sexual Activity   Alcohol use: Yes    Comment: Only occasionally on the weekends with my friends   Drug use: Never   Sexual activity: Yes    Birth control/protection: Condom, Pill  Other Topics Concern   Not on file  Social History Narrative   Not on file   Social Drivers of Health   Financial Resource Strain: Low Risk  (04/07/2023)   Overall Financial Resource Strain (CARDIA)    Difficulty of Paying Living Expenses: Not hard at all  Food Insecurity: No Food Insecurity (04/07/2023)   Hunger Vital Sign    Worried About Running Out of Food in the Last Year: Never true    Ran Out of Food in the Last Year: Never true  Transportation Needs:  No Transportation Needs (04/07/2023)   PRAPARE - Administrator, Civil Service (Medical): No    Lack of Transportation (Non-Medical): No  Physical Activity: Sufficiently Active (04/07/2023)   Exercise Vital Sign    Days of Exercise per Week: 4 days    Minutes of Exercise per Session: 40 min  Stress: No Stress Concern Present (04/07/2023)   [employer] of Occupational Health - Occupational Stress Questionnaire    Feeling of Stress : Only a little  Social Connections: Moderately Integrated (04/07/2023)   Social Connection and Isolation Panel [NHANES]    Frequency of  Communication with Friends and Family: Three times a week    Frequency of Social Gatherings with Friends and Family: More than three times a week    Attends Religious Services: More than 4 times per year    Active Member of [employer] or Organizations: Yes    Attends Engineer, Structural: More than 4 times per year    Marital Status: Never married  Intimate Partner Violence: Not At Risk (04/07/2023)   Humiliation, Afraid, Rape, and Kick questionnaire    Fear of Current or Ex-Partner: No    Emotionally Abused: No    Physically Abused: No    Sexually Abused: No   Outpatient Medications Prior to Visit  Medication Sig Dispense Refill   albuterol (VENTOLIN HFA) 108 (90 Base) MCG/ACT inhaler Inhale into the lungs every 6 (six) hours as needed for wheezing or shortness of breath.     norgestrel-ethinyl estradiol (LO/OVRAL) 0.3-30 MG-MCG tablet Take 1 tablet by mouth daily.     vancomycin  (VANCOCIN ) 125 MG capsule Take 125 mg by mouth 4 (four) times daily.     dicyclomine  (BENTYL ) 20 MG tablet Take 1 tablet (20 mg total) by mouth 2 (two) times daily. 20 tablet 0   mupirocin ointment (BACTROBAN) 2 %      ondansetron  (ZOFRAN ) 4 MG tablet Take 1 tablet (4 mg total) by mouth every 8 (eight) hours as needed for nausea or vomiting. 20 tablet 0   terbinafine  (LAMISIL  AT) 1 % cream Apply 1 application topically 2 (two) times daily. 30 g 0   No facility-administered medications prior to visit.   No Known Allergies  ROS: A complete ROS was performed with pertinent positives/negatives noted in the HPI. The remainder of the ROS are negative.   Objective:   Today's Vitals   04/07/23 1302  BP: 117/70  Pulse: 66  SpO2: 100%  Weight: 129 lb (58.5 kg)  Height: 5' 6.5 (1.689 m)    GENERAL: Well-appearing, in NAD. Well nourished.  SKIN: Pink, warm and dry. No rash, lesion, ulceration, or ecchymoses.  Head: Normocephalic. NECK: Trachea midline. Full ROM w/o pain or tenderness.  RESPIRATORY: Chest  wall symmetrical. Respirations even and non-labored. GI: Abdomen soft, non-tender. Hyperactive bowel sounds. No rebound tenderness. No hepatomegaly or splenomegaly.  MSK: Muscle tone and strength appropriate for age.  EXTREMITIES: Without clubbing, cyanosis, or edema.  NEUROLOGIC: No motor or sensory deficits. Steady, even gait. C2-C12 intact.  PSYCH/MENTAL STATUS: Alert, oriented x 3. Cooperative, appropriate mood and affect.       Assessment & Plan:  1. Recurrent Clostridioides difficile infection (Primary) Urgent referral placed to GI for likely recurring Cdiff infection responsive for Vancomycin  therapy. Pt to complete course of Vancomycin  currently and continue probiotic. If diarrhea returns prior to GI appt, return to PCP.  - Ambulatory referral to Gastroenterology  2. Encounter to establish care with new doctor Discussed role of  PCP with patinet and will return for AE within the next 3 months.    Patient to reach out to office if new, worrisome, or unresolved symptoms arise or if no improvement in patient's condition. Patient verbalized understanding and is agreeable to treatment plan. All questions answered to patient's satisfaction.    Return in about 2 months (around 06/13/2023) for ANNUAL PHYSICAL.    Thersia Schuyler Stark, [place]

## 2023-04-07 NOTE — Telephone Encounter (Signed)
-----   Message from Shelton Silvas Caudle sent at 04/07/2023  1:40 PM EST ----- Please work on ASAP referral to GI for patient. Thank you

## 2023-04-08 NOTE — Progress Notes (Deleted)
 04/08/2023 Amanda Cross  Amanda Cross [id number] Oct 14, 2000  Referring provider: Knute Thersia Bitters, * Primary GI doctor: {acdocs:27040}  ASSESSMENT AND PLAN:   Assessment and Plan              Patient Care Team: Patient, No Pcp Per as PCP - General (General Practice)  HISTORY OF PRESENT ILLNESS: 23 y.o. female with a past medical history of ***and others listed below presents for evaluation of ***.   Discussed the use of AI scribe software for clinical note transcription with the patient, who gave verbal consent to proceed.  History of Present Illness             She {Actions; denies-reports:120008} blood thinner use.  She {Actions; denies-reports:120008} NSAID use.  She {Actions; denies-reports:120008} ETOH use.   She {Actions; denies-reports:120008} tobacco use.  She {Actions; denies-reports:120008} drug use.    She  reports that she has never smoked. She has never used smokeless tobacco. She reports current alcohol use. She reports that she does not use drugs.  RELEVANT LABS AND IMAGING:  Results          CBC    Component Value Date/Time   WBC 5.9 03/12/2023 0725   RBC 4.27 03/12/2023 0725   HGB 12.9 03/12/2023 0725   HCT 38.1 03/12/2023 0725   PLT 199 03/12/2023 0725   MCV 89.2 03/12/2023 0725   MCH 30.2 03/12/2023 0725   MCHC 33.9 03/12/2023 0725   RDW 11.8 03/12/2023 0725   LYMPHSABS 1.9 03/12/2023 0725   MONOABS 0.6 03/12/2023 0725   EOSABS 0.2 03/12/2023 0725   BASOSABS 0.0 03/12/2023 0725   Recent Labs    03/10/23 1332 03/12/23 0725  HGB 14.3 12.9    CMP     Component Value Date/Time   NA 137 03/12/2023 0725   K 3.9 03/12/2023 0725   CL 103 03/12/2023 0725   CO2 27 03/12/2023 0725   GLUCOSE 93 03/12/2023 0725   BUN 12 03/12/2023 0725   CREATININE 0.75 03/12/2023 0725   CALCIUM 9.1 03/12/2023 0725   PROT 6.8 03/12/2023 0725   ALBUMIN 4.0 03/12/2023 0725   AST 13 (L) 03/12/2023 0725   ALT 10 03/12/2023 0725   ALKPHOS 50  03/12/2023 0725   BILITOT 0.5 03/12/2023 0725   GFRNONAA >60 03/12/2023 0725      Latest Ref Rng & Units 03/12/2023    7:25 AM 03/10/2023    1:32 PM  Hepatic Function  Total Protein 6.5 - 8.1 g/dL 6.8  7.8   Albumin 3.5 - 5.0 g/dL 4.0  4.6   AST 15 - 41 U/L 13  16   ALT 0 - 44 U/L 10  12   Alk Phosphatase 38 - 126 U/L 50  50   Total Bilirubin <1.2 mg/dL 0.5  0.5       Current Medications:   Current Outpatient Medications (Endocrine & Metabolic):    norgestrel-ethinyl estradiol (LO/OVRAL) 0.3-30 MG-MCG tablet, Take 1 tablet by mouth daily.   Current Outpatient Medications (Respiratory):    albuterol (VENTOLIN HFA) 108 (90 Base) MCG/ACT inhaler, Inhale into the lungs every 6 (six) hours as needed for wheezing or shortness of breath.    Current Outpatient Medications (Other):    vancomycin  (VANCOCIN ) 125 MG capsule, Take 125 mg by mouth 4 (four) times daily.  Medical History:  Past Medical History:  Diagnosis Date   Allergy    exercise   Allergies: No Known Allergies   Surgical History:  She  has a past surgical history that includes Cyst excision (Left, 06/08/2019). Family History:  Her family history includes Asthma in her father.  REVIEW OF SYSTEMS  : All other systems reviewed and negative except where noted in the History of Present Illness.  PHYSICAL EXAM: LMP 04/05/2023 (Exact Date)  General Appearance: Well nourished, in no apparent distress. Head:   Normocephalic and atraumatic. Eyes:  sclerae anicteric,conjunctive pink  Respiratory: Respiratory effort normal, BS equal bilaterally without rales, rhonchi, wheezing. Cardio: RRR with no MRGs. Peripheral pulses intact.  Abdomen: Soft,  {BlankSingle:19197::Flat,Obese,Non-distended} ,active bowel sounds. {actendernessAB:27319} tenderness {anatomy; site abdomen:5010}. {BlankMultiple:19196::Without guarding,With guarding,Without rebound,With rebound}. No masses. Rectal:  {acrectalexam:27461} Musculoskeletal: Full ROM, {PSY - GAIT AND STATION:22860} gait. {With/Without:304960234} edema. Skin:  Dry and intact without significant lesions or rashes Neuro: Alert and  oriented x4;  No focal deficits. Psych:  Cooperative. Normal mood and affect.    Amanda JONELLE Coombs, PA-C 1:31 PM

## 2023-04-11 ENCOUNTER — Other Ambulatory Visit (HOSPITAL_BASED_OUTPATIENT_CLINIC_OR_DEPARTMENT_OTHER): Payer: Self-pay | Admitting: Family Medicine

## 2023-04-11 ENCOUNTER — Telehealth (HOSPITAL_BASED_OUTPATIENT_CLINIC_OR_DEPARTMENT_OTHER): Payer: Self-pay | Admitting: Family Medicine

## 2023-04-11 DIAGNOSIS — A498 Other bacterial infections of unspecified site: Secondary | ICD-10-CM

## 2023-04-11 NOTE — Telephone Encounter (Signed)
 Copied from CRM [id number]. Topic: Referral - Question >> Apr 11, 2023 10:48 AM Zacyrah J wrote: Reason for CRM: pt was told by Dr.Olivia that she can switch referral from [facility] to [facility] if needed and pt need to switch referral to [facility]  Copied from CRM [id number]. Topic: Referral - Question >> Apr 11, 2023 10:48 AM Zacyrah J wrote: Reason for CRM: pt was told by Dr.Olivia that she can switch referral from [facility] to [facility] if needed and pt need to switch referral to [facility]

## 2023-04-13 ENCOUNTER — Ambulatory Visit: Payer: BC Managed Care – PPO | Admitting: Physician Assistant

## 2023-04-15 DIAGNOSIS — K602 Anal fissure, unspecified: Secondary | ICD-10-CM | POA: Diagnosis not present

## 2023-04-15 DIAGNOSIS — A0471 Enterocolitis due to Clostridium difficile, recurrent: Secondary | ICD-10-CM | POA: Diagnosis not present

## 2023-05-07 DIAGNOSIS — K602 Anal fissure, unspecified: Secondary | ICD-10-CM | POA: Diagnosis not present

## 2023-05-07 DIAGNOSIS — A0471 Enterocolitis due to Clostridium difficile, recurrent: Secondary | ICD-10-CM | POA: Diagnosis not present

## 2023-06-06 ENCOUNTER — Encounter (HOSPITAL_BASED_OUTPATIENT_CLINIC_OR_DEPARTMENT_OTHER): Payer: BC Managed Care – PPO | Admitting: Family Medicine

## 2023-07-11 DIAGNOSIS — R300 Dysuria: Secondary | ICD-10-CM | POA: Diagnosis not present

## 2023-09-21 DIAGNOSIS — D2272 Melanocytic nevi of left lower limb, including hip: Secondary | ICD-10-CM | POA: Diagnosis not present

## 2023-09-21 DIAGNOSIS — L578 Other skin changes due to chronic exposure to nonionizing radiation: Secondary | ICD-10-CM | POA: Diagnosis not present

## 2023-09-21 DIAGNOSIS — L814 Other melanin hyperpigmentation: Secondary | ICD-10-CM | POA: Diagnosis not present

## 2023-09-21 DIAGNOSIS — D225 Melanocytic nevi of trunk: Secondary | ICD-10-CM | POA: Diagnosis not present

## 2023-09-25 DIAGNOSIS — S52571A Other intraarticular fracture of lower end of right radius, initial encounter for closed fracture: Secondary | ICD-10-CM | POA: Diagnosis not present

## 2023-09-25 DIAGNOSIS — S59901A Unspecified injury of right elbow, initial encounter: Secondary | ICD-10-CM | POA: Diagnosis not present

## 2023-09-25 DIAGNOSIS — S52121A Displaced fracture of head of right radius, initial encounter for closed fracture: Secondary | ICD-10-CM | POA: Diagnosis not present

## 2023-09-26 DIAGNOSIS — S52124A Nondisplaced fracture of head of right radius, initial encounter for closed fracture: Secondary | ICD-10-CM | POA: Diagnosis not present

## 2023-09-30 DIAGNOSIS — S52124A Nondisplaced fracture of head of right radius, initial encounter for closed fracture: Secondary | ICD-10-CM | POA: Diagnosis not present

## 2023-10-05 DIAGNOSIS — Z113 Encounter for screening for infections with a predominantly sexual mode of transmission: Secondary | ICD-10-CM | POA: Diagnosis not present

## 2023-10-05 DIAGNOSIS — Z01419 Encounter for gynecological examination (general) (routine) without abnormal findings: Secondary | ICD-10-CM | POA: Diagnosis not present

## 2023-10-05 DIAGNOSIS — Z124 Encounter for screening for malignant neoplasm of cervix: Secondary | ICD-10-CM | POA: Diagnosis not present

## 2023-10-14 DIAGNOSIS — S52124D Nondisplaced fracture of head of right radius, subsequent encounter for closed fracture with routine healing: Secondary | ICD-10-CM | POA: Diagnosis not present

## 2023-11-18 DIAGNOSIS — S52124D Nondisplaced fracture of head of right radius, subsequent encounter for closed fracture with routine healing: Secondary | ICD-10-CM | POA: Diagnosis not present
